# Patient Record
Sex: Male | Born: 1935 | Race: White | Hispanic: No | Marital: Married
Health system: Southern US, Community
[De-identification: ages and names within clinical notes are randomized; demographics above are authoritative.]

---

## 2003-07-27 ENCOUNTER — Ambulatory Visit (HOSPITAL_COMMUNITY): Admission: RE | Admit: 2003-07-27 | Discharge: 2003-07-27 | Payer: Self-pay | Admitting: Vascular Surgery

## 2004-12-01 IMAGING — CR DG CHEST 2V
2 series · 2 of 2 positions shown · non-contrast
Comparison: none

CLINICAL DATA: Pre-admit for aortoiliac occlusive disease. 
 TWO VIEW CHEST 
 No priors. 
 The heart and vascularity are normal.  The lungs are mildly hyperaerated but clear.  Osseous structures intact. 
 IMPRESSION
 Chronic changes ? no active disease.

[view not recorded (1 of 2)]
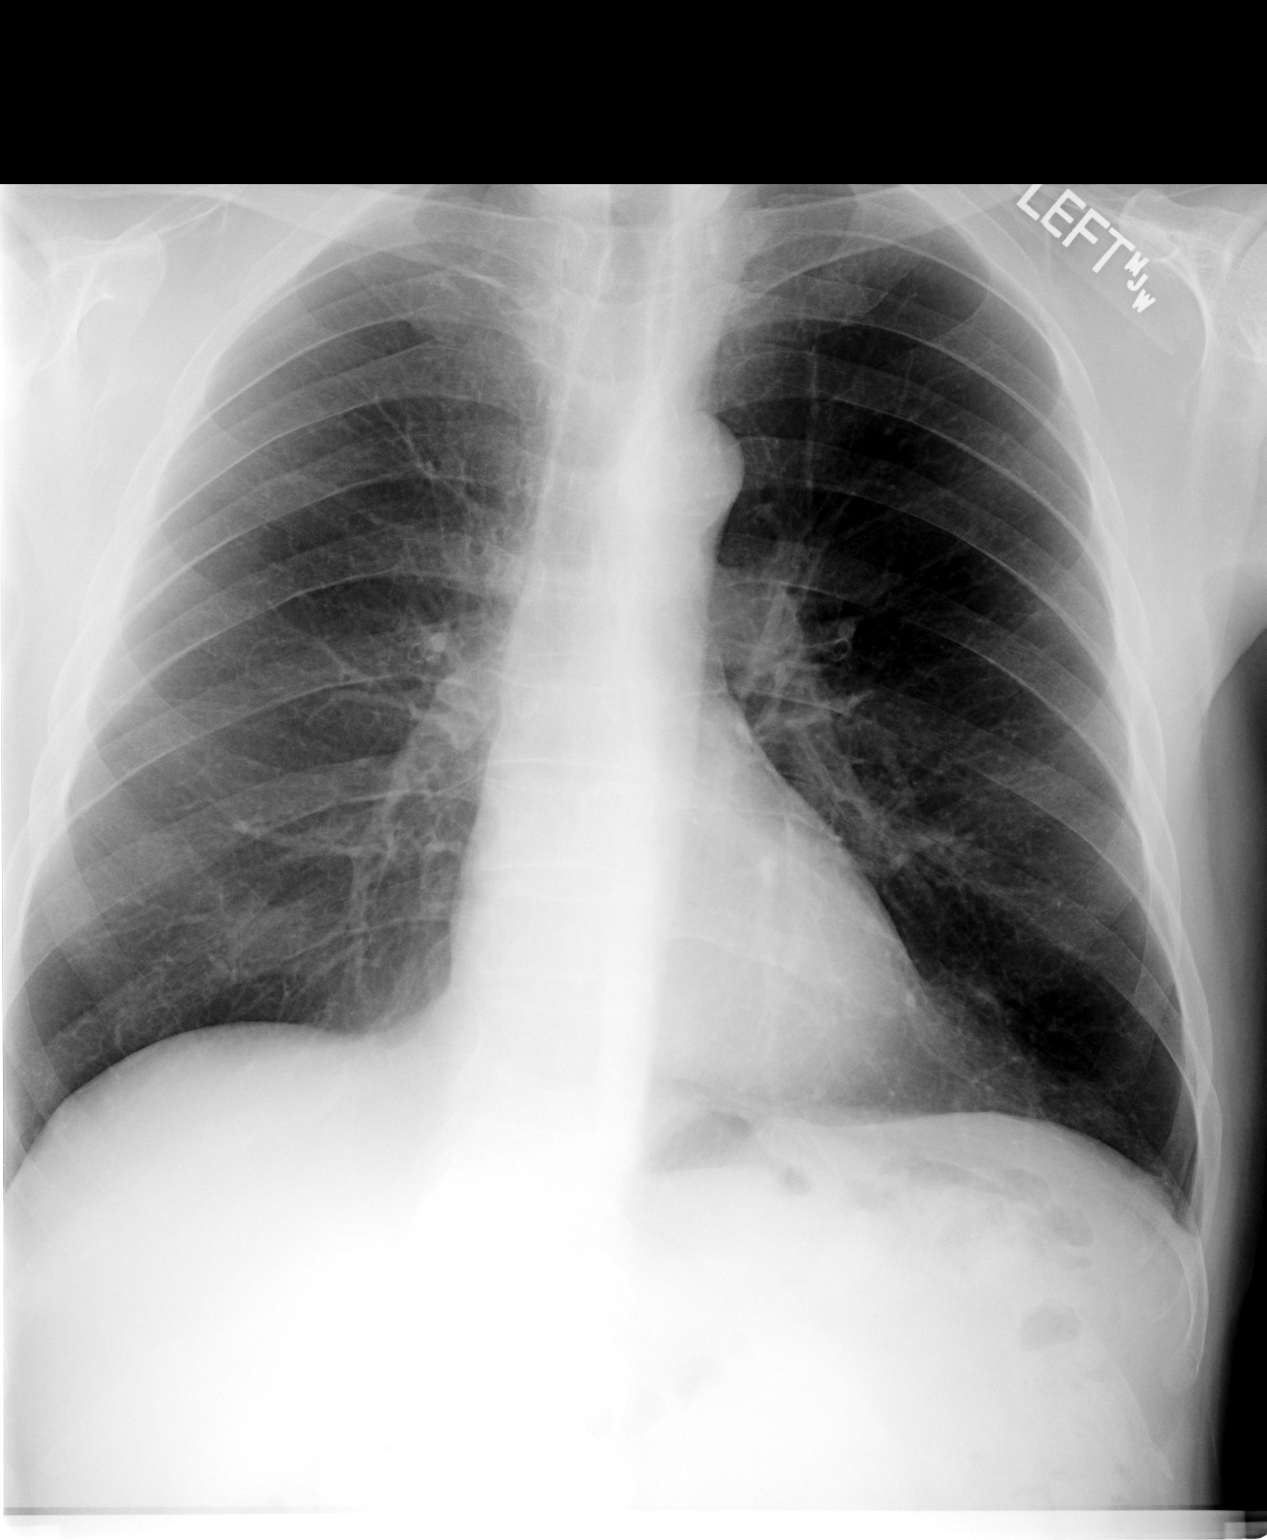

[view not recorded (2 of 2)]
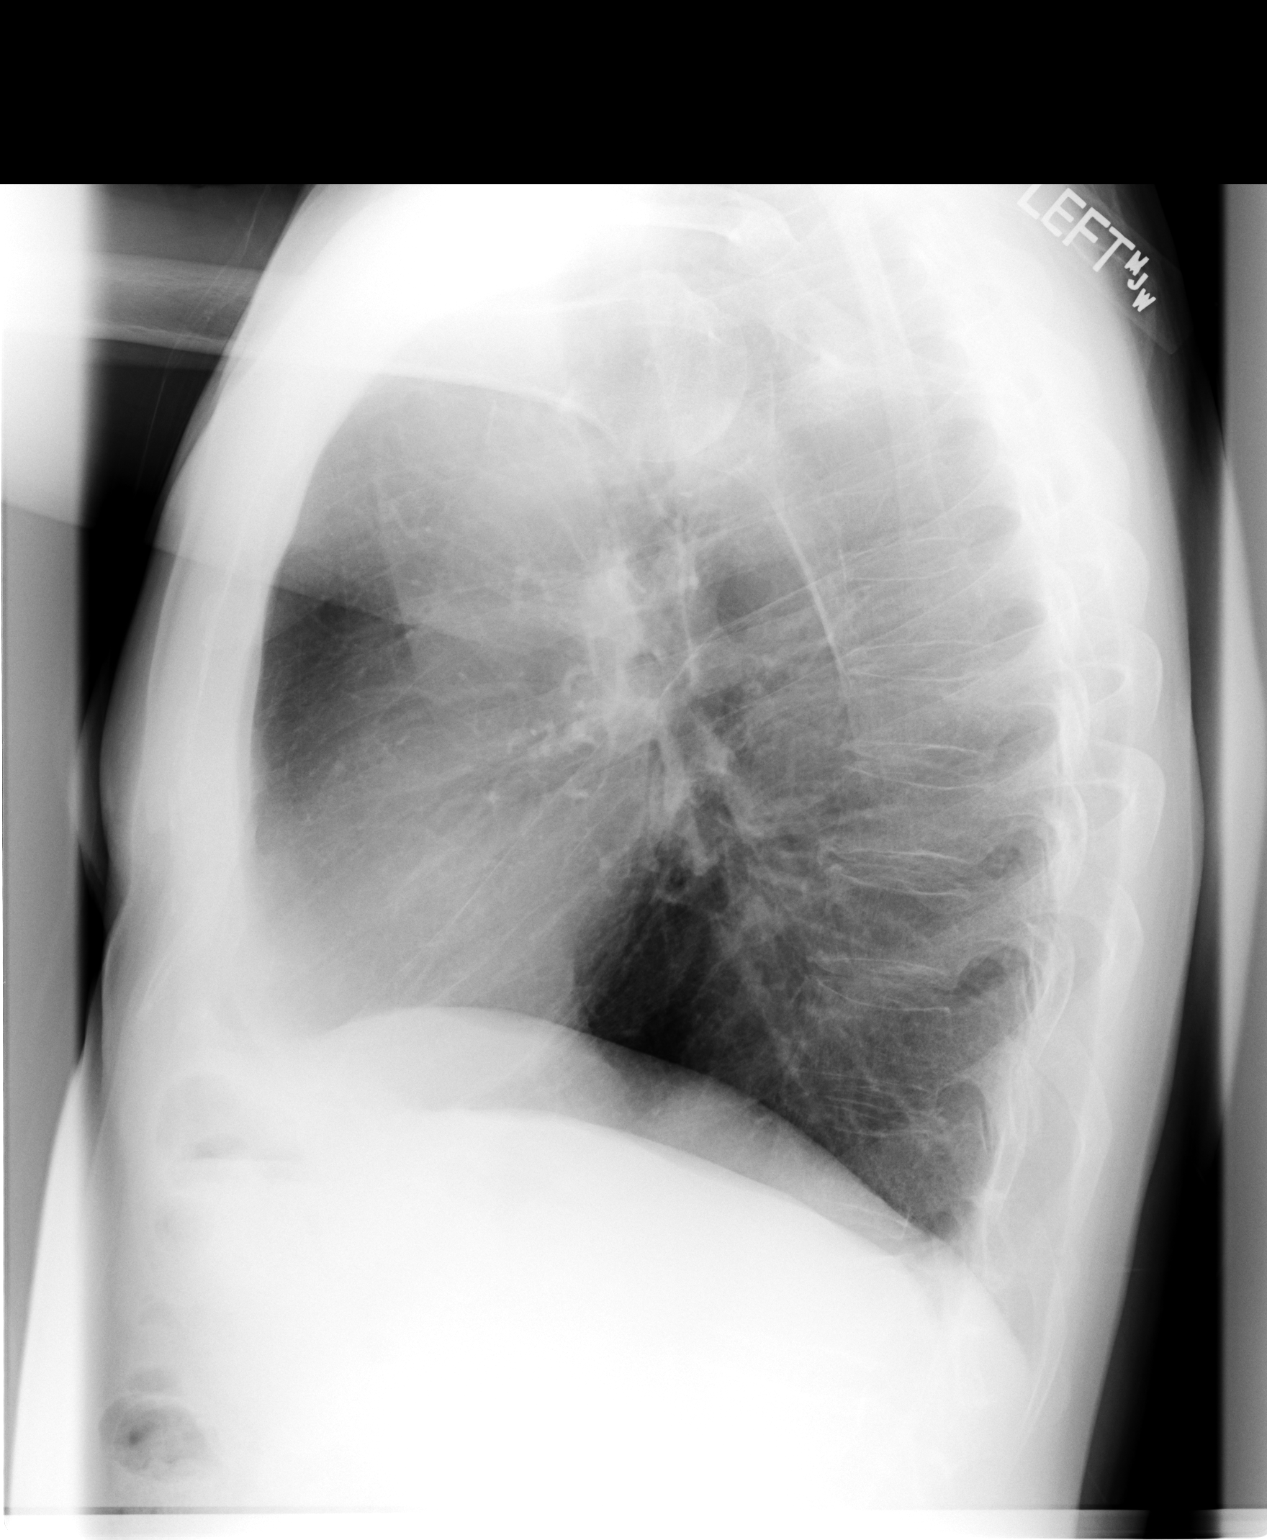

[2 of 2 positions shown; findings below may reference images not displayed]

## 2006-07-21 ENCOUNTER — Ambulatory Visit: Payer: Self-pay | Admitting: Vascular Surgery

## 2007-07-21 ENCOUNTER — Ambulatory Visit: Payer: Self-pay | Admitting: Vascular Surgery

## 2010-07-16 NOTE — Procedures (Signed)
CAROTID DUPLEX EXAM   INDICATION:  Followup of known carotid artery disease.  Patient is  asymptomatic.   HISTORY:  Diabetes:  No  Cardiac:  No.  Hypertension:  No.  Smoking:  No.  Previous Surgery:  Bilateral CFA, PTA and stents.  CV History:  Amaurosis Fugax No, Paresthesias No, Hemiparesis No                                       RIGHT             LEFT  Brachial systolic pressure:         138               138  Brachial Doppler waveforms:         WNL               WNL  Vertebral direction of flow:        Antegrade         Antegrade  DUPLEX VELOCITIES (cm/sec)  CCA peak systolic                   75                84  ECA peak systolic                   117               110  ICA peak systolic                   128               119  ICA end diastolic                   33                28  PLAQUE MORPHOLOGY:                  Calcific with shadowing             Mixed, calcific with  shadowing  PLAQUE AMOUNT:                      Moderate          Moderate  PLAQUE LOCATION:                    CCA, bifurcation, ICA               CCA, bifurcation, ICA   IMPRESSION:  1. Diffuse plaquing noted throughout bilateral CCAs, left > right.  2. Bilateral 40 to 59% ICA stenoses, however acoustic shadowing may      have obscured higher velocities.  3. Bilateral vertebral arteries with antegrade flow.  4. Bilateral ICA velocities are essentially stable from that on      02/13/2004.   ___________________________________________  Brendan Harrison. Hart Rochester, M.D.   PB/MEDQ  D:  07/22/2007  T:  07/22/2007  Job:  [id number]

## 2010-07-19 NOTE — Op Note (Signed)
NAME:  GURFATEH, MCCLAIN                       ACCOUNT NO.:  [id number]   MEDICAL RECORD NO.:  [id number]                   PATIENT TYPE:  OIB   LOCATION:  2869                                 FACILITY:  [facility]   PHYSICIAN:  Quita Skye. Hart Rochester, M.D.               DATE OF BIRTH:  1925/09/16   DATE OF PROCEDURE:  07/27/2003  DATE OF DISCHARGE:                                 OPERATIVE REPORT   PREOPERATIVE DIAGNOSIS:  Right lower extremity claudication secondary to  aortoiliac occlusive disease.   POSTOPERATIVE DIAGNOSIS:  Right lower extremity claudication secondary to  aortoiliac occlusive disease.   PROCEDURE:  1. Abdominal aortogram with bilateral lower extremity run off via right     common femoral approach.  2. Right common iliac percutaneous transluminal angioplasty and stenting     using 8 mm by 39 mm Genesis on Opta at 10 atmospheres for 45 seconds and     at 8 atmospheres for 30 seconds.  3. Left common iliac percutaneous transluminal angioplasty and stenting     using 8 mm by 29 mm Genesis on Opta at 8 atmospheres for 45 seconds via     left common femoral approach.  4. Completion angiogram.   SURGEON:  Quita Skye. Hart [place], M.D.   ANESTHESIA:  Local Xylocaine.   CONTRAST:  195 mL.   HEPARIN:  4000 units.   COMPLICATIONS:  None.   DESCRIPTION OF PROCEDURE:  The patient was taken to the [facility]  peripheral endovascular lab and placed in the supine position at which time  both groins were prepped with Betadine solution and draped in routine  sterile manner.  After infiltration with 1% Xylocaine with epinephrine, the  right common femoral artery was entered percutaneously and a guide-wire was  passed into the suprarenal aorta under fluoroscopic guidance.  A 5 French  sheath and dilator were passed over the guide-wire and dilator removed.  A  standard pigtail catheter was positioned in the suprarenal aorta.  Flush  abdominal aortogram was performed injecting 20 mL of  contrast at 20 mL per  second.  The kidneys were symmetrical with smallish renal arteries but no  significant stenoses noted.  The inferior mesenteric artery was not  visualized and thought to be occluded.  The aorta had diffuse calcification  but no significant stenoses.  The common iliac artery on the right had a  severe 90% stenosis at the proximal 1 cm with a plaque extending down an  additional 2-3 cm to a point proximal to the internal iliac artery  origination.  The remainder of the common, internal, and external iliac  arteries were widely patent on the right.  There was an endoluminal plaque  on the left about a cm distal to the origin of the left common iliac artery  causing an approximate 40-50% stenosis.  The distal common, internal, and  external iliac arteries on the left were widely patent.  The catheter was  withdrawn into the terminal aorta and bilateral lower extremity run off  performed injecting 88 mL of contrast at 8 mL per second.  This revealed the  distal iliac arteries, the common femoral, superficial femoral, profunda  femoral, popliteal, and tibial vessels to be normal bilaterally with no  significant occlusive disease.  Following this, RAO and LAO projections of  the iliac systems were obtained injecting 15 mL on each occasion to better  delineate the plaque and the decision was made to proceed with bilateral  iliac PTA and stenting, having informed the patient of this and its risks  preoperatively.   The left common femoral was entered percutaneously after infiltrating with  Xylocaine and a long 7 sheath passed up the left side and in the right side,  the 5 sheath was exchanged for a long 7 sheath.  4000 units of heparin was  given intravenously.  On the right side, a 39 mm by 8 mm Genesis on Opta  system was utilized and on the left side, an 8 mm by 29 mm Genesis on Opta.  Simultaneous inflations were performed at 10 atmospheres for 45 seconds on  the right  and at 8 atmospheres for 45 seconds on the left.  Following  completion of this, because of some wasting at the proximal portion of the  stent, further inflation with the balloon on the right was performed at 8  atmospheres for 30 seconds.  A completion angiogram was then performed which  revealed widely patent stents bilaterally.  There was significant  calcification visualized in the right iliac system following the insertion  of the stent but there seemed to be excellent flow through the stented  portion of the vessel.  Having tolerated the procedure well, after reversal  of the ACT, the sheaths were removed.  Adequate compression was applied.  No  complications ensued.   FINDINGS:  1. Widely patent aorta and bilateral renal arteries.  2. 90-95% proximal right common iliac stenosis and 50% proximal left common     iliac stenosis.  3. Normal distal run off.  4. Successful PTA and stenting of bilateral common iliac arteries using an 8     by 39 Genesis on Opta on the right common iliac artery and an 8 by 29     Genesis on Opta on the left common iliac system.                                               Quita Skye Hart Rochester, M.D.    JDL/MEDQ  D:  07/27/2003  T:  07/27/2003  Job:  [id number]

## 2010-07-19 NOTE — Procedures (Signed)
LOWER EXTREMITY ARTERIAL EVALUATION-SINGLE LEVEL   INDICATION:  Follow-up evaluation of bilateral CFA, PTA, and stents.  Patient denies claudication and rest pain.   HISTORY:  Diabetes:  No.  Cardiac:  No.  Hypertension:  No.  Smoking:  No.  Previous Surgery:  Bilateral CFA, PTA, and stents.   RESTING SYSTOLIC PRESSURES: (ABI)                          RIGHT                LEFT  Brachial:               138                  138  Anterior tibial:        160                  150  Posterior tibial:       165 (>1.0)           168 (>1.0)  Peroneal:  DOPPLER WAVEFORM ANALYSIS:  Anterior tibial:        Triphasic            Biphasic  Posterior tibial:       Triphasic            Triphasic  Peroneal:   PREVIOUS ABI'S:  Date: 07/21/06  RIGHT:  >1.0  LEFT:  >1.0   IMPRESSION:  Stable ankle brachial indices bilaterally.   ___________________________________________  Quita Skye Hart Rochester, M.D.   PB/MEDQ  D:  07/22/2007  T:  07/22/2007  Job:  [id number]

## 2013-09-20 ENCOUNTER — Ambulatory Visit (INDEPENDENT_AMBULATORY_CARE_PROVIDER_SITE_OTHER): Payer: Medicare Other | Admitting: Podiatrist

## 2013-09-20 VITALS — BP 83/59 | HR 60 | Resp 17

## 2013-09-20 DIAGNOSIS — M79676 Pain in unspecified toe(s): Principal | ICD-10-CM

## 2013-09-20 DIAGNOSIS — B351 Tinea unguium: Secondary | ICD-10-CM

## 2013-09-20 DIAGNOSIS — M79609 Pain in unspecified limb: Secondary | ICD-10-CM

## 2013-09-20 NOTE — Patient Instructions (Signed)
GENERAL FOOT HEALTH INFORMATION:  Moisturize your feet regularly with a cream based lotion.   I recommend Cetaphil (Cream) or Eucerin (Cream)- usually available in a tub or crock type of container.  Avoid applying the cream to the toe interspaces themselves to reduce the risk of a fungal infection between the toes.  After showering or bathing be sure to dry well between your toes.     Watch your toenails for any signs of infection including drainage, pus redness or swelling along the sides of the toenails.  Soak in epsom salt water and use antibiotic ointment (OTC) if you notice this start to occur.  If the redness does not resolve within 2-3 days, call for an appointment to be seen.

## 2013-09-20 NOTE — Progress Notes (Signed)
   Subjective:    Patient ID: Brendan Harrison, male    DOB: August 18, 1925, 80 y.o.   MRN: [id number]  HPI  Pt presents with need for nail debridement, denies any issues or foot pain.  Review of Systems  Constitutional: Positive for unexpected weight change.  HENT: Positive for hearing loss.   Genitourinary: Positive for urgency.  Musculoskeletal: Positive for back pain.  Hematological: Bruises/bleeds easily.  Psychiatric/Behavioral: Positive for confusion.  All other systems reviewed and are negative.      Objective:   Physical Exam Patient is awake, alert, and oriented x 3.  In no acute distress.  Vascular status is intact with palpable pedal pulses at 1/4 DP and PT bilateral and capillary refill time within normal limits. Neurological sensation is also intact bilaterally via Semmes Weinstein monofilament at 5/5 sites. Light touch, vibratory sensation, Achilles tendon reflex is intact. Dermatological exam reveals skin color, turger and texture as normal. No open lesions present.  Musculature intact with dorsiflexion, plantarflexion, inversion, eversion.  Patient's toenails are elongated, thickened, dystrophic and mycotic. It appears he's tried to trim them in his past and has pinched the skin underneath the toenails causing bleeding. No redness, swelling or signs of infection are present.    Assessment & Plan:  Symptomatic mycotic toenails  Plan: Debridement of toenails was carried out today without complication he'll be seen back as needed or in 3 months for followup.

## 2014-01-06 ENCOUNTER — Ambulatory Visit (INDEPENDENT_AMBULATORY_CARE_PROVIDER_SITE_OTHER): Payer: Medicare Other

## 2014-01-06 VITALS — BP 120/67

## 2014-01-06 DIAGNOSIS — B351 Tinea unguium: Secondary | ICD-10-CM

## 2014-01-06 DIAGNOSIS — M79676 Pain in unspecified toe(s): Secondary | ICD-10-CM

## 2014-01-06 NOTE — Progress Notes (Signed)
   Subjective:    Patient ID: Brendan Harrison, male    DOB: 1925-05-16, 80 y.o.   MRN: [id number]  HPI  TOENAILS TRIM.  Review of Systemsno new findings or systemic changes noted     Objective:   Physical Exam Lower extremity objective findings reveal intact although weakened pulses DP and PT plus one over 4 bilateral there is significant +2 edema both ankles and feet. She didn't +2 pitting noted mild varicosities noted capillary refill timed 3-4 seconds there is tacked epicritic sensation on Semmes Weinstein testing. There is normal plantar response DTRs not elicited dermatologically skin color pigment normal nails criptotic incurvated friable dystrophic hallux second third and fourth digit lateral incurvated tender painful on palpation and debridement and enclosed shoe wear. Orthopedic biomechanical exam reveals rectus foot type skin muscle strength intact although somewhat weakened there is mild semirigid digital contractures. No open wounds no ulcers no secondary infection is noted       Assessment & Plan:  Assessment this time is onychomycosis painful mycotic nails 1 through 4 bilateral mycotic nails debrided 8 at this time return for future palliative nail care on an as-needed basis suggest 3-4 month follow-up for nail care  Alvan Dameichard Madalina Rosman DPM

## 2014-01-06 NOTE — Patient Instructions (Signed)

## 2014-04-07 ENCOUNTER — Ambulatory Visit: Payer: Medicare Other

## 2014-09-01 DEATH — deceased
